# Patient Record
Sex: Male | Born: 2000 | Race: Black or African American | Hispanic: No | Marital: Single | Smoking: Never smoker
Health system: Southern US, Community
[De-identification: ages and names within clinical notes are randomized; demographics above are authoritative.]

## PROBLEM LIST (undated history)

## (undated) DIAGNOSIS — K219 Gastro-esophageal reflux disease without esophagitis: Secondary | ICD-10-CM

## (undated) DIAGNOSIS — J45909 Unspecified asthma, uncomplicated: Secondary | ICD-10-CM

---
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
## 2022-11-08 ENCOUNTER — Other Ambulatory Visit (INDEPENDENT_AMBULATORY_CARE_PROVIDER_SITE_OTHER): Payer: Self-pay | Admitting: Neurology

## 2022-11-08 MED ORDER — AMITRIPTYLINE HCL 25 MG PO TABS: 12.5000 mg | ORAL_TABLET | Freq: Every day | ORAL | 0 refills | Status: DC

## 2022-11-08 NOTE — Telephone Encounter (Signed)
  Name of who is calling: Royann Shivers Relationship to Patient: Mom  Best contact number: [phone]  Provider they see: Dr.Nab  Reason for call: Mom is calling to speak with provider or someone from clinical staff. She states that Baine's been having  bad headaches. The medications that provider prescribed doesn't work. Mom is requesting a callback.      PRESCRIPTION REFILL ONLY  Name of prescription: amitriptyline '25mg'$   Pharmacy: [facility]

## 2022-11-08 NOTE — Telephone Encounter (Signed)
Called and notified "Mom" that patient will need to call office to discuss any information re: himself or his care (Due to age).  B. Roten CMA  Note: Upon return call discuss the following:  Last Visit was 12-08-2020 and the following discussed:  Instructions   Return in about 7 months (around 07/10/2021). Continue the same low dose of amitriptyline at half a tablet every night May benefit from taking dietary supplements Continue with more hydration, adequate sleep and limiting screen time May take occasional Tylenol or ibuprofen for moderate to severe headache Call my office if there are more frequent headaches Return in 7 months for follow-up visit     Patient-Parent (No Show'd) follow up appt, no appointments or calls since.  B. Roten CMA

## 2022-11-08 NOTE — Telephone Encounter (Signed)
HEADACHES  Acute Headache or Most recent.   When did the headache begin? Last Lamonte Sakai began "today" started "by coming out of no where" and the "last few weeks they have been more recurrent and seem to come around more with stress".    How does it compare to other headaches? "They vary, lately with nausea". Seems to be a cluster "and varies".    Describe the pain. "Throbbing" most recently.   Is photosensitivity present? "Most recent one's, yes".    Is the visual disturbed? If so, how? No   Does noise worsen the headache? "The most recent, yes" and "have to get in a quite place".    Does activity worsen or relieve the headache? "It kinda depends, most times, less activity helps".   Is nausea present? "The most recent, yes"   Is nausea present? If so, how may times and in what period of time? "Just the one from yesterday for a small amount of time, no vomiting"   Rate the headache on a 0-10 scale. 8-"most recent worst one"   Have medication been tried? If so, what medication, what dose, when was the medication given. If this is a prolonged headache, how many doses have been given? Still had the Amitriptyline and used that, Taking "the whole 25 mg nightly". Restarted recently.   Have any other treatments been tried? Has anything relieved the headache or made it worse? "Haven't had to use medicine in a while".    If the patient awakened with the headache - clarify if they woke up with the headache or if the headache woke them up. "On Monday, awakened with ha".   Send the message to provider  Note: I let the patient know that I will send to provider for review. No recent visit and due to age of patient will await advise from Dr. Jordan Hawks.  Lurline Idol CMA  Patient can be reached at: [phone]

## 2022-11-11 NOTE — Telephone Encounter (Signed)
LM for patient that Rx was sent. If any further refills are needed (appointment is recommended). Also, LM that an additional visit is needed to discuss current symptoms and need for transition to adult neurology.  B. Roten CMA

## 2022-11-11 NOTE — Therapy (Signed)
OUTPATIENT PHYSICAL THERAPY LOWER EXTREMITY EVALUATION   Patient Name: Tony Gray MRN: [id number] DOB:10-11-2000, 22 y.o., male Today's Date: 11/13/2022  END OF SESSION:  PT End of Session - 11/12/22 1401     Visit Number 1    Number of Visits 17    Date for PT Re-Evaluation 01/11/23    Authorization Type [employer]    PT Start Time 1401    PT Stop Time 1436    PT Time Calculation (min) 35 min    Activity Tolerance Patient tolerated treatment well    Behavior During Therapy WFL for tasks assessed/performed             Past Medical History:  Diagnosis Date   Acid reflux    Asthma    History reviewed. No pertinent surgical history. There are no problems to display for this patient.   PCP[facility]   REFERRING PROVIDER: Margart Sickles, PA-C  REFERRING DIAG:  S80.02XA (ICD-10-CM) - Contusion of left knee  S93.402A (ICD-10-CM) - Left ankle sprain    THERAPY DIAG:  Pain in left ankle and joints of left foot  Acute pain of left knee  Muscle weakness (generalized)  Other abnormalities of gait and mobility  Localized edema  Rationale for Evaluation and Treatment: Rehabilitation  ONSET DATE: January 2023   SUBJECTIVE:   SUBJECTIVE STATEMENT: In early January he was at track practice and was doing a long jump and when he landed the Lt ankle ankle rolled in and his knee collapsed in. He reports having immediate pain more so in the knee than the ankle. He remembers hearing a pop in the ankle. He was able to walk after this injury and was able to finish track practice that day. The following day he was having more pain. He talked with his coach about his pain and was instructed to rest and use ice for a couple weeks. His knee pain improved a little bit following this period of rest, but his ankle pain stayed the same. He was then seen by orthopedics and had an MRI on the knee that showed a bone bruise and an X-ray of the  ankle that was unremarkable. He reports his knee pain has gone away at this point, but he continues to have ankle pain. He notices more pain with any sort of pressure on the foot.   PERTINENT HISTORY: None  PAIN:  Are you having pain? Yes: NPRS scale: 7 (9 at worst)/10 Pain location: Lt lateral ankle Pain description: sharp;throbbing Aggravating factors: prolonged standing, excessive movement Relieving factors: sitting, rest  PRECAUTIONS: None  WEIGHT BEARING RESTRICTIONS: No  FALLS:  Has patient fallen in last 6 months? No  LIVING ENVIRONMENT: Lives with: lives with their family Lives in: House/apartment Stairs: No Has following equipment at home: None  OCCUPATION: [employer]  PLOF: Independent  PATIENT GOALS: "lower the pain and get back to exercise"    OBJECTIVE:   DIAGNOSTIC FINDINGS: none on file   PATIENT SURVEYS:  LEFS 63/80  COGNITION: Overall cognitive status: Within functional limits for tasks assessed     SENSATION: Not tested  EDEMA:  Mild swelling about Lt lateral ankle    POSTURE: No Significant postural limitations  PALPATION: TTP ATFL and CFL   LOWER EXTREMITY ROM:  Active ROM Right eval Left eval  Hip flexion    Hip extension    Hip abduction    Hip adduction    Hip internal rotation  Hip external rotation    Knee flexion  WNL  Knee extension  WNL  Ankle dorsiflexion 12 2*  Ankle plantarflexion  WNL  Ankle inversion 20 20*  Ankle eversion 15 8*    (Blank rows = not tested)  LOWER EXTREMITY MMT:  MMT Right eval Left eval  Hip flexion    Hip extension    Hip abduction    Hip adduction    Hip internal rotation    Hip external rotation    Knee flexion  5  Knee extension  5  Ankle dorsiflexion  4+ pn  Ankle plantarflexion Full SL calf raise Partial SL calf raise  Ankle inversion  4 pn  Ankle eversion  5   (Blank rows = not tested)  LOWER EXTREMITY SPECIAL TESTS:  (+ pain) Anterior Drawer (+ pain) talar  tilt (-) Squeeze   FUNCTIONAL TESTS:  Squat: WNL SLS: 30 seconds bilateral  SLS on airex: 30 seconds bilateral, moderate sway LLE   GAIT: Distance walked: 15 ft  Assistive device utilized: None Level of assistance: Complete Independence Comments: antalgic Lt    [facility] Adult PT Treatment:                                                DATE: 11/12/22 Therapeutic Exercise: Demonstrated and issue initial HEP.    Therapeutic Activity: Education on assessment findings that will be addressed throughout duration of POC.       PATIENT EDUCATION:  Education details: see treatment  Person educated: Patient Education method: Explanation, Demonstration, Tactile cues, Verbal cues, and Handouts Education comprehension: verbalized understanding, returned demonstration, verbal cues required, tactile cues required, and needs further education  HOME EXERCISE PROGRAM: Access Code: [email or web address] URL: [email or web address] Date: 11/12/2022 Prepared by: Letitia Libra  Exercises - Long Sitting Calf Stretch with Strap  - 1 x daily - 7 x weekly - 3 sets - 30 sec  hold - Isometric Ankle Inversion at Wall  - 1 x daily - 7 x weekly - 2 sets - 10 reps - 5 sec hold - Isometric Ankle Eversion at Wall  - 1 x daily - 7 x weekly - 2 sets - 10 reps - 5 sec hold - Long Sitting Isometric Ankle Plantarflexion with Ball at [employer]  - 1 x daily - 7 x weekly - 2 sets - 10 reps - 5 sec hold - Isometric Ankle Dorsiflexion and Plantarflexion  - 1 x daily - 7 x weekly - 2 sets - 10 reps - 5 sec hold - Seated Heel Raise  - 1 x daily - 7 x weekly - 2 sets - 10 reps - Seated Toe Raise  - 1 x daily - 7 x weekly - 2 sets - 10 reps  ASSESSMENT:  CLINICAL IMPRESSION: Patient is a 22 y.o. male who was seen today for physical therapy evaluation and treatment for acute Lt ankle and knee pain that he sustained when landing from a high jump maneuver in January 2024. He reports recent resolution of his knee pain, but has  ongoing Lt lateral ankle pain that is consistent with a lateral ankle sprain. He demonstrates limited and painful Lt ankle AROM as well as weakness, balance impairments, and gait abnormalities. He is unable to participate in sport-specific activity at this time (including running and jumping) secondary to pain and the above objective impairments. He  will benefit from skilled PT to address the above stated deficits in order to return to PLOF.   OBJECTIVE IMPAIRMENTS: Abnormal gait, decreased activity tolerance, decreased balance, decreased endurance, decreased knowledge of condition, decreased mobility, difficulty walking, decreased ROM, decreased strength, hypomobility, impaired flexibility, improper body mechanics, and pain.   ACTIVITY LIMITATIONS: carrying, lifting, bending, standing, squatting, and locomotion level  PARTICIPATION LIMITATIONS: meal prep, cleaning, laundry, shopping, community activity, and sport activity/recreation  PERSONAL FACTORS: Profession and Time since onset of injury/illness/exacerbation are also affecting patient's functional outcome.   REHAB POTENTIAL: Good  CLINICAL DECISION MAKING: Stable/uncomplicated  EVALUATION COMPLEXITY: Low   GOALS: Goals reviewed with patient? Yes  SHORT TERM GOALS: Target date: 12/10/2022   Patient will be independent and compliant with initial HEP.   Baseline: issued at eval  Goal status: INITIAL  2.  Patient will demonstrate at least 13 degrees of Lt ankle eversion AROM to improve gait mechanics.  Baseline: see above  Goal status: INITIAL  3.  Patient will demonstrate at least 10 degrees of Lt ankle DF AROM to reduce stress on the ankle and knee with squatting and jumping activity.  Baseline: see above Goal status: INITIAL  4.  Patient will be able to complete full range SL calf raise on the LLE without pain to improve his ability to complete SL hopping/jumping.  Baseline: see above  Goal status: INITIAL   LONG TERM  GOALS: Target date: 01/08/2023    Patient will demonstrate pain free 5/5 Lt ankle strength to improve stability with running activity.  Baseline: see above  Goal status: INITIAL  2.  Patient will score at least 73/80 on the LEFS to signify clinically meaningful improvement in functional abilities.  Baseline: see above  Goal status: INITIAL  3.  Patient will report pain as worst rated as 4/10 to reduce his current functional limitations.  Baseline: 9 Goal status: INITIAL  4.  Patient will be able to jump and hop with no signs of instability without pain in order to return to sport.  Baseline: unable  Goal status: INITIAL  5.  Patient will be independent with advanced home program to progress/maintain current level of function.  Baseline: initial HEP issued  Goal status: INITIAL    PLAN:  PT FREQUENCY: 2x/week  PT DURATION: 8 weeks  PLANNED INTERVENTIONS: Therapeutic exercises, Therapeutic activity, Neuromuscular re-education, Balance training, Gait training, Patient/Family education, Self Care, Joint mobilization, Aquatic Therapy, Dry Needling, Electrical stimulation, Cryotherapy, Vasopneumatic device, Ionotophoresis 4mg /ml Dexamethasone, Manual therapy, and Re-evaluation  PLAN FOR NEXT SESSION: review and update HEP prn; manual to Lt ankle consider MWM, ankle strengthening and stretching.   Letitia Libra, PT, DPT, ATC 11/13/22 1:39 PM  Check all possible CPT codes: 16109 - PT Re-evaluation, 97110- Therapeutic Exercise, [phone]- Neuro Re-education, [phone] - Gait Training, [phone] - Manual Therapy, [phone] - Therapeutic Activities, [phone] - Self Care, and [phone] - Aquatic therapy    Check all conditions that are expected to impact treatment: None of these apply   If treatment provided at initial evaluation, no treatment charged due to lack of authorization.

## 2022-11-12 ENCOUNTER — Other Ambulatory Visit: Payer: Self-pay

## 2022-11-12 ENCOUNTER — Ambulatory Visit: Payer: Medicaid Other | Attending: Physician Assistant

## 2022-11-12 DIAGNOSIS — R2689 Other abnormalities of gait and mobility: Secondary | ICD-10-CM

## 2022-11-12 DIAGNOSIS — M6281 Muscle weakness (generalized): Secondary | ICD-10-CM | POA: Diagnosis present

## 2022-11-12 DIAGNOSIS — M25562 Pain in left knee: Secondary | ICD-10-CM | POA: Diagnosis present

## 2022-11-12 DIAGNOSIS — R600 Localized edema: Secondary | ICD-10-CM | POA: Diagnosis present

## 2022-11-12 DIAGNOSIS — M25572 Pain in left ankle and joints of left foot: Secondary | ICD-10-CM

## 2022-11-21 ENCOUNTER — Ambulatory Visit: Payer: Medicaid Other | Admitting: Physical Therapy

## 2022-11-26 ENCOUNTER — Ambulatory Visit: Payer: Medicaid Other

## 2022-11-26 DIAGNOSIS — R2689 Other abnormalities of gait and mobility: Secondary | ICD-10-CM

## 2022-11-26 DIAGNOSIS — M6281 Muscle weakness (generalized): Secondary | ICD-10-CM

## 2022-11-26 DIAGNOSIS — R600 Localized edema: Secondary | ICD-10-CM

## 2022-11-26 DIAGNOSIS — M25562 Pain in left knee: Secondary | ICD-10-CM

## 2022-11-26 DIAGNOSIS — M25572 Pain in left ankle and joints of left foot: Secondary | ICD-10-CM

## 2022-11-26 NOTE — Therapy (Signed)
OUTPATIENT PHYSICAL THERAPY TREATMENT NOTE   Patient Name: Tony Gray MRN: [id number] DOB:02-26-2001, 22 y.o., male Today's Date: 11/26/2022  PCP: [facility]  REFERRING PROVIDER: Chriss Czar, PA-C   END OF SESSION:   PT End of Session - 11/26/22 1236     Visit Number 2    Number of Visits 17    Date for PT Re-Evaluation 01/11/23    Authorization Type [employer]    Authorization Time Period 3/14-5/12/24    Authorization - Visit Number 1    Authorization - Number of Visits 5    PT Start Time [id number]    PT Stop Time 1316    PT Time Calculation (min) 40 min    Activity Tolerance Patient tolerated treatment well    Behavior During Therapy WFL for tasks assessed/performed             Past Medical History:  Diagnosis Date   Acid reflux    Asthma    History reviewed. No pertinent surgical history. There are no problems to display for this patient.   REFERRING DIAG:  S80.02XA (ICD-10-CM) - Contusion of left knee  S93.402A (ICD-10-CM) - Left ankle sprain    THERAPY DIAG:  Pain in left ankle and joints of left foot  Acute pain of left knee  Muscle weakness (generalized)  Other abnormalities of gait and mobility  Localized edema  Rationale for Evaluation and Treatment Rehabilitation  PERTINENT HISTORY: none   PRECAUTIONS: none  SUBJECTIVE:                                                                                                                                                                                      SUBJECTIVE STATEMENT:  Patient reports the exercises are helping and his ankle is feeling better.    PAIN:  Are you having pain? No   OBJECTIVE: (objective measures completed at initial evaluation unless otherwise dated)  DIAGNOSTIC FINDINGS: none on file    PATIENT SURVEYS:  LEFS 63/80   COGNITION: Overall cognitive status: Within functional limits for tasks assessed                          SENSATION: Not tested   EDEMA:  Mild swelling about Lt lateral ankle      POSTURE: No Significant postural limitations   PALPATION: TTP ATFL and CFL    LOWER EXTREMITY ROM:   Active ROM Right eval Left eval 11/26/22 Left   Hip flexion       Hip extension       Hip abduction  Hip adduction       Hip internal rotation       Hip external rotation       Knee flexion   WNL   Knee extension   WNL   Ankle dorsiflexion 12 2* 11  Ankle plantarflexion   WNL   Ankle inversion 20 20*   Ankle eversion 15 8*     (Blank rows = not tested)   LOWER EXTREMITY MMT:   MMT Right eval Left eval  Hip flexion      Hip extension      Hip abduction      Hip adduction      Hip internal rotation      Hip external rotation      Knee flexion   5  Knee extension   5  Ankle dorsiflexion   4+ pn  Ankle plantarflexion Full SL calf raise Partial SL calf raise  Ankle inversion   4 pn  Ankle eversion   5   (Blank rows = not tested)   LOWER EXTREMITY SPECIAL TESTS:  (+ pain) Anterior Drawer (+ pain) talar tilt (-) Squeeze    FUNCTIONAL TESTS:  Squat: WNL SLS: 30 seconds bilateral  SLS on airex: 30 seconds bilateral, moderate sway LLE    GAIT: Distance walked: 15 ft  Assistive device utilized: None Level of assistance: Complete Independence Comments: antalgic Lt    [facility] Adult PT Treatment:                                                DATE: 11/26/22 Therapeutic Exercise: Recumbent bike level 3 x 5 minutes  Calf stretch on wedge gastroc and soleus x 30 sec each  Resisted ankle inversion/eversion and dorsiflexion red band 2 x 10  Resisted ankle plantarflexion black band 2 x 10  Standing ankle rockerboard 2 x 10  Eccentric calf raise and DL calf raises attempted d/c due to pain DL calf raise on leg press @ 20 lbs 2 x 10  Manual Therapy: Lt ankle inversion MWM x 10  Lt Ankle mobilizations to improve DF/PF    [facility] Adult PT Treatment:                                                 DATE: 11/12/22 Therapeutic Exercise: Demonstrated and issue initial HEP.      Therapeutic Activity: Education on assessment findings that will be addressed throughout duration of POC.            PATIENT EDUCATION:  Education details: see treatment  Person educated: Patient Education method: Explanation, Demonstration, Tactile cues, Verbal cues, and Handouts Education comprehension: verbalized understanding, returned demonstration, verbal cues required, tactile cues required, and needs further education   HOME EXERCISE PROGRAM: Access Code: [email or web address] URL: [email or web address] Date: 11/12/2022 Prepared by: Gwendolyn Grant   Exercises - Long Sitting Calf Stretch with Strap  - 1 x daily - 7 x weekly - 3 sets - 30 sec  hold - Isometric Ankle Inversion at Wall  - 1 x daily - 7 x weekly - 2 sets - 10 reps - 5 sec hold - Isometric Ankle Eversion at Wall  - 1 x daily - 7 x weekly - 2 sets -  10 reps - 5 sec hold - Long Sitting Isometric Ankle Plantarflexion with Ball at [employer]  - 1 x daily - 7 x weekly - 2 sets - 10 reps - 5 sec hold - Isometric Ankle Dorsiflexion and Plantarflexion  - 1 x daily - 7 x weekly - 2 sets - 10 reps - 5 sec hold - Seated Heel Raise  - 1 x daily - 7 x weekly - 2 sets - 10 reps - Seated Toe Raise  - 1 x daily - 7 x weekly - 2 sets - 10 reps   ASSESSMENT:   CLINICAL IMPRESSION: Patient arrives without reports of ankle or knee pain. Lt ankle DF AROM has significantly improved compared to evaluation having met this short term functional goal. Able to initiate ankle resistance strengthening today without onset of lateral ankle pain. He reported sharp lateral ankle pain when attempted DL and eccentric calf raise, so these were discontinued. Able to complete calf raises on the leg press without onset of pain. HEP updated to include further strengthening.    OBJECTIVE IMPAIRMENTS: Abnormal gait, decreased activity tolerance, decreased balance, decreased  endurance, decreased knowledge of condition, decreased mobility, difficulty walking, decreased ROM, decreased strength, hypomobility, impaired flexibility, improper body mechanics, and pain.    ACTIVITY LIMITATIONS: carrying, lifting, bending, standing, squatting, and locomotion level   PARTICIPATION LIMITATIONS: meal prep, cleaning, laundry, shopping, community activity, and sport activity/recreation   PERSONAL FACTORS: Profession and Time since onset of injury/illness/exacerbation are also affecting patient's functional outcome.    REHAB POTENTIAL: Good   CLINICAL DECISION MAKING: Stable/uncomplicated   EVALUATION COMPLEXITY: Low     GOALS: Goals reviewed with patient? Yes   SHORT TERM GOALS: Target date: 12/10/2022     Patient will be independent and compliant with initial HEP.    Baseline: issued at eval  Goal status: INITIAL   2.  Patient will demonstrate at least 13 degrees of Lt ankle eversion AROM to improve gait mechanics.  Baseline: see above  Goal status: INITIAL   3.  Patient will demonstrate at least 10 degrees of Lt ankle DF AROM to reduce stress on the ankle and knee with squatting and jumping activity.  Baseline: see above Goal status: met   4.  Patient will be able to complete full range SL calf raise on the LLE without pain to improve his ability to complete SL hopping/jumping.  Baseline: see above  Goal status: INITIAL     LONG TERM GOALS: Target date: 01/08/2023       Patient will demonstrate pain free 5/5 Lt ankle strength to improve stability with running activity.  Baseline: see above  Goal status: INITIAL   2.  Patient will score at least 73/80 on the LEFS to signify clinically meaningful improvement in functional abilities.  Baseline: see above  Goal status: INITIAL   3.  Patient will report pain as worst rated as 4/10 to reduce his current functional limitations.  Baseline: 9 Goal status: INITIAL   4.  Patient will be able to jump and hop  with no signs of instability without pain in order to return to sport.  Baseline: unable  Goal status: INITIAL   5.  Patient will be independent with advanced home program to progress/maintain current level of function.  Baseline: initial HEP issued  Goal status: INITIAL       PLAN:   PT FREQUENCY: 2x/week   PT DURATION: 8 weeks   PLANNED INTERVENTIONS: Therapeutic exercises, Therapeutic activity, Neuromuscular re-education, Balance  training, Gait training, Patient/Family education, Self Care, Joint mobilization, Aquatic Therapy, Dry Needling, Electrical stimulation, Cryotherapy, Vasopneumatic device, Ionotophoresis 4mg /ml Dexamethasone, Manual therapy, and Re-evaluation   PLAN FOR NEXT SESSION: review and update HEP prn;  ankle strengthening and stretching. Balance     Gwendolyn Grant, PT, DPT, ATC 11/26/22 1:16 PM

## 2022-11-28 ENCOUNTER — Other Ambulatory Visit: Payer: Self-pay

## 2022-11-28 ENCOUNTER — Ambulatory Visit: Payer: Medicaid Other | Admitting: Physical Therapy

## 2022-11-28 ENCOUNTER — Encounter: Payer: Self-pay | Admitting: Physical Therapy

## 2022-11-28 DIAGNOSIS — M25572 Pain in left ankle and joints of left foot: Secondary | ICD-10-CM

## 2022-11-28 DIAGNOSIS — M6281 Muscle weakness (generalized): Secondary | ICD-10-CM

## 2022-11-28 DIAGNOSIS — R600 Localized edema: Secondary | ICD-10-CM

## 2022-11-28 DIAGNOSIS — M25562 Pain in left knee: Secondary | ICD-10-CM

## 2022-11-28 DIAGNOSIS — R2689 Other abnormalities of gait and mobility: Secondary | ICD-10-CM

## 2022-11-28 NOTE — Therapy (Signed)
OUTPATIENT PHYSICAL THERAPY TREATMENT NOTE   Patient Name: Tony Gray MRN: [id number] DOB:[birth year], 22 y.o., male Today's Date: 11/28/2022   PCP: [facility]  REFERRING PROVIDER: Chriss Czar, PA-C    END OF SESSION:   PT End of Session - 11/28/22 1413     Visit Number 3    Number of Visits 17    Date for PT Re-Evaluation 01/11/23    Authorization Type [employer]    Authorization Time Period 3/14-5/12/24    Authorization - Visit Number 2    Authorization - Number of Visits 5    PT Start Time [id number]    PT Stop Time 1445    PT Time Calculation (min) 41 min    Activity Tolerance Patient tolerated treatment well    Behavior During Therapy WFL for tasks assessed/performed              Past Medical History:  Diagnosis Date   Acid reflux    Asthma    History reviewed. No pertinent surgical history. There are no problems to display for this patient.   REFERRING DIAG:  S80.02XA (ICD-10-CM) - Contusion of left knee  S93.402A (ICD-10-CM) - Left ankle sprain    THERAPY DIAG:  Pain in left ankle and joints of left foot  Acute pain of left knee  Muscle weakness (generalized)  Other abnormalities of gait and mobility  Localized edema  Rationale for Evaluation and Treatment Rehabilitation  PERTINENT HISTORY: None   PRECAUTIONS: None   SUBJECTIVE:                                                                                                                                                                                     SUBJECTIVE STATEMENT:  Patient reports continued improvement. He does continue to get pain on the lateral ankle with running.  PAIN:  Are you having pain? No   OBJECTIVE: (objective measures completed at initial evaluation unless otherwise dated) PATIENT SURVEYS:  LEFS 63/80   EDEMA:  Mild swelling about Lt lateral ankle   PALPATION: TTP ATFL and CFL    LOWER EXTREMITY ROM:   Active  ROM Right eval Left eval 11/26/22 Left   Hip flexion       Hip extension       Hip abduction       Hip adduction       Hip internal rotation       Hip external rotation       Knee flexion   WNL   Knee extension   WNL   Ankle dorsiflexion 12 2* 11  Ankle plantarflexion   WNL  Ankle inversion 20 20*   Ankle eversion 15 8*     (Blank rows = not tested)   LOWER EXTREMITY MMT:   MMT Right eval Left eval  Hip flexion      Hip extension      Hip abduction      Hip adduction      Hip internal rotation      Hip external rotation      Knee flexion   5  Knee extension   5  Ankle dorsiflexion   4+ pn  Ankle plantarflexion Full SL calf raise Partial SL calf raise  Ankle inversion   4 pn  Ankle eversion   5   (Blank rows = not tested)   LOWER EXTREMITY SPECIAL TESTS:  (+ pain) Anterior Drawer (+ pain) talar tilt (-) Squeeze    FUNCTIONAL TESTS:  Squat: WNL SLS: 30 seconds bilateral  SLS on airex: 30 seconds bilateral, moderate sway LLE    GAIT: Distance walked: 15 ft  Assistive device utilized: None Level of assistance: Complete Independence Comments: antalgic Lt     TODAY'S TREATMENT [facility] Adult PT Treatment:                                                DATE: 11/28/22 Therapeutic Exercise: Recumbent bike L3 x 5 minutes  Slant board calf stretch 3 x 30 sec Heel raises x 10, with ball squeeze between heels 2 x 10 SLS x 30 sec, on Airex 2 x 30 sec Forward heel tap 4" x 10, 6" x 10 Triple extension at wall 2 x 5 each SL squat on BOSU 2 x 5 each - fingertip assist for balance POGO hop stationary 2 x 20, fwd/bwd 2 x 20, lateral 2 x 20   [facility] Adult PT Treatment:                                                DATE: 11/26/22 Therapeutic Exercise: Recumbent bike level 3 x 5 minutes  Calf stretch on wedge gastroc and soleus x 30 sec each  Resisted ankle inversion/eversion and dorsiflexion red band 2 x 10  Resisted ankle plantarflexion black band 2 x 10  Standing ankle  rockerboard 2 x 10  Eccentric calf raise and DL calf raises attempted d/c due to pain DL calf raise on leg press @ 20 lbs 2 x 10  Manual Therapy: Lt ankle inversion MWM x 10  Lt Ankle mobilizations to improve DF/PF   PATIENT EDUCATION:  Education details: HEP update Person educated: Patient Education method: Explanation, Demonstration, Tactile cues, Verbal cues, and Handouts Education comprehension: verbalized understanding, returned demonstration, verbal cues required, tactile cues required, and needs further education   HOME EXERCISE PROGRAM: Access Code: [email or web address]     ASSESSMENT: CLINICAL IMPRESSION: Patient tolerated therapy well with no adverse effects. Therapy focused on progressing left ankle strength and control and initiated plyometric exercises with good tolerance. He was able to tolerate standing strengthening well this visit with no report of pain. He does demonstrate greater arch collapse on the left with weight bearing squatting tasks and had greater difficulty with SL control on the left. Updated his HEP to progress standing strengthening and plyometrics for home to  improve ankle control and tolerance for impact tasks such as running. Patient would benefit from continued skilled PT to progress his mobility and strength in order to reduce pain and maximize functional ability.    OBJECTIVE IMPAIRMENTS: Abnormal gait, decreased activity tolerance, decreased balance, decreased endurance, decreased knowledge of condition, decreased mobility, difficulty walking, decreased ROM, decreased strength, hypomobility, impaired flexibility, improper body mechanics, and pain.    ACTIVITY LIMITATIONS: carrying, lifting, bending, standing, squatting, and locomotion level   PARTICIPATION LIMITATIONS: meal prep, cleaning, laundry, shopping, community activity, and sport activity/recreation   PERSONAL FACTORS: Profession and Time since onset of injury/illness/exacerbation are also affecting  patient's functional outcome.      GOALS: Goals reviewed with patient? Yes   SHORT TERM GOALS: Target date: 12/10/2022    Patient will be independent and compliant with initial HEP.    Baseline: issued at eval  Goal status: INITIAL   2.  Patient will demonstrate at least 13 degrees of Lt ankle eversion AROM to improve gait mechanics.  Baseline: see above  Goal status: INITIAL   3.  Patient will demonstrate at least 10 degrees of Lt ankle DF AROM to reduce stress on the ankle and knee with squatting and jumping activity.  Baseline: see above Goal status: met   4.  Patient will be able to complete full range SL calf raise on the LLE without pain to improve his ability to complete SL hopping/jumping.  Baseline: see above  Goal status: INITIAL     LONG TERM GOALS: Target date: 01/08/2023   Patient will demonstrate pain free 5/5 Lt ankle strength to improve stability with running activity.  Baseline: see above  Goal status: INITIAL   2.  Patient will score at least 73/80 on the LEFS to signify clinically meaningful improvement in functional abilities.  Baseline: see above  Goal status: INITIAL   3.  Patient will report pain as worst rated as 4/10 to reduce his current functional limitations.  Baseline: 9 Goal status: INITIAL   4.  Patient will be able to jump and hop with no signs of instability without pain in order to return to sport.  Baseline: unable  Goal status: INITIAL   5.  Patient will be independent with advanced home program to progress/maintain current level of function.  Baseline: initial HEP issued  Goal status: INITIAL       PLAN: PT FREQUENCY: 2x/week   PT DURATION: 8 weeks   PLANNED INTERVENTIONS: Therapeutic exercises, Therapeutic activity, Neuromuscular re-education, Balance training, Gait training, Patient/Family education, Self Care, Joint mobilization, Aquatic Therapy, Dry Needling, Electrical stimulation, Cryotherapy, Vasopneumatic device,  Ionotophoresis 4mg /ml Dexamethasone, Manual therapy, and Re-evaluation   PLAN FOR NEXT SESSION: review and update HEP prn;  ankle strengthening and stretching. Balance     Hilda Blades, PT, DPT, LAT, ATC 11/28/22  3:52 PM Phone: [phone] Fax: [phone]

## 2022-11-28 NOTE — Patient Instructions (Signed)
Access Code: [email or web address] URL: [email or web address] Date: 11/28/2022 Prepared by: Hilda Blades  Exercises - Seated Heel Raise  - 1 x daily - 7 x weekly - 2 sets - 10 reps - Seated Toe Raise  - 1 x daily - 7 x weekly - 2 sets - 10 reps - Seated Ankle Eversion with Resistance  - 1 x daily - 2 sets - 10 reps - Seated Figure 4 Ankle Inversion with Resistance  - 1 x daily - 2 sets - 10 reps - Long Sitting Ankle Dorsiflexion with Anchored Resistance  - 1 x daily - 2 sets - 10 reps - Long Sitting Ankle Plantar Flexion with Resistance  - 1 x daily - 2 sets - 10 reps - Gastroc Stretch on Wall  - 1 x daily - 3 reps - 30 seconds hold - Soleus Stretch on Wall  - 1 x daily - 3 reps - 30 seconds hold - Standing Heel Raise  - 1 x daily - 3 sets - 10 reps - Forward Tape Jumps  - 3 sets - 20 reps - Sideways Tape Jumps  - 3 sets - 20 reps

## 2022-12-03 ENCOUNTER — Ambulatory Visit: Payer: Medicaid Other

## 2022-12-03 ENCOUNTER — Telehealth: Payer: Self-pay

## 2022-12-03 NOTE — Telephone Encounter (Signed)
LVM regarding missed PT appointment. Reviewed attendance policy and reminded patient of next scheduled visit.   Gwendolyn Grant, PT, DPT, ATC 12/03/22 10:00 AM

## 2022-12-05 ENCOUNTER — Ambulatory Visit: Payer: Medicaid Other

## 2022-12-05 DIAGNOSIS — M25562 Pain in left knee: Secondary | ICD-10-CM

## 2022-12-05 DIAGNOSIS — M25572 Pain in left ankle and joints of left foot: Secondary | ICD-10-CM | POA: Diagnosis not present

## 2022-12-05 DIAGNOSIS — R2689 Other abnormalities of gait and mobility: Secondary | ICD-10-CM

## 2022-12-05 DIAGNOSIS — M6281 Muscle weakness (generalized): Secondary | ICD-10-CM

## 2022-12-05 DIAGNOSIS — R600 Localized edema: Secondary | ICD-10-CM

## 2022-12-05 NOTE — Therapy (Signed)
OUTPATIENT PHYSICAL THERAPY TREATMENT NOTE   Patient Name: Tony Gray MRN: [id number] DOB:2000-10-14, 22 y.o., male Today's Date: 12/05/2022   PCP: [facility]  REFERRING PROVIDER: Chriss Czar, PA-C    END OF SESSION:   PT End of Session - 12/05/22 0942     Visit Number 4    Number of Visits 17    Date for PT Re-Evaluation 01/11/23    Authorization Type [employer]    Authorization Time Period 3/14-5/12/24    Authorization - Visit Number 3    Authorization - Number of Visits 5    PT Start Time [phone]   patient late   PT Stop Time 1015    PT Time Calculation (min) 33 min    Activity Tolerance Patient tolerated treatment well    Behavior During Therapy [facility] for tasks assessed/performed               Past Medical History:  Diagnosis Date   Acid reflux    Asthma    History reviewed. No pertinent surgical history. There are no problems to display for this patient.   REFERRING DIAG:  S80.02XA (ICD-10-CM) - Contusion of left knee  S93.402A (ICD-10-CM) - Left ankle sprain    THERAPY DIAG:  Pain in left ankle and joints of left foot  Acute pain of left knee  Muscle weakness (generalized)  Other abnormalities of gait and mobility  Localized edema  Rationale for Evaluation and Treatment Rehabilitation  PERTINENT HISTORY: None   PRECAUTIONS: None   SUBJECTIVE:                                                                                                                                                                                     SUBJECTIVE STATEMENT:  Patient reports the ankle feels good right now without pain. His banded ankle exercises are getting easier.   PAIN:  Are you having pain? No   OBJECTIVE: (objective measures completed at initial evaluation unless otherwise dated) PATIENT SURVEYS:  LEFS 63/80   EDEMA:  Mild swelling about Lt lateral ankle   PALPATION: TTP ATFL and CFL    LOWER  EXTREMITY ROM:   Active ROM Right eval Left eval 11/26/22 Left  12/05/22 Left   Hip flexion        Hip extension        Hip abduction        Hip adduction        Hip internal rotation        Hip external rotation        Knee flexion   WNL    Knee extension  WNL    Ankle dorsiflexion 12 2* 11   Ankle plantarflexion   WNL    Ankle inversion 20 20*  21  Ankle eversion 15 8*   18   (Blank rows = not tested)   LOWER EXTREMITY MMT:   MMT Right eval Left eval  Hip flexion      Hip extension      Hip abduction      Hip adduction      Hip internal rotation      Hip external rotation      Knee flexion   5  Knee extension   5  Ankle dorsiflexion   4+ pn  Ankle plantarflexion Full SL calf raise Partial SL calf raise  Ankle inversion   4 pn  Ankle eversion   5   (Blank rows = not tested)   LOWER EXTREMITY SPECIAL TESTS:  (+ pain) Anterior Drawer (+ pain) talar tilt (-) Squeeze    FUNCTIONAL TESTS:  Squat: WNL SLS: 30 seconds bilateral  SLS on airex: 30 seconds bilateral, moderate sway LLE    GAIT: Distance walked: 15 ft  Assistive device utilized: None Level of assistance: Complete Independence Comments: antalgic Lt     TODAY'S TREATMENT [facility] Adult PT Treatment:                                                DATE: 12/03/22 Therapeutic Exercise: Elliptical level 4 ramp 6 x 5 minutes  Walking lunge 4 x 15 ft  SL leg press 2 x 10 @ 80 lbs, 100 lbs SL calf raise 2 x 10   Squat jumps 3 x 5 Heel/toe walks 2 x 15 ft  Updated HEP   Neuromuscular re-ed: Step up knee driver on bosu 2 x 10 forward and lateral  SL marble pickup with shin trace x 10 each     [facility] Adult PT Treatment:                                                DATE: 11/28/22 Therapeutic Exercise: Recumbent bike L3 x 5 minutes  Slant board calf stretch 3 x 30 sec Heel raises x 10, with ball squeeze between heels 2 x 10 SLS x 30 sec, on Airex 2 x 30 sec Forward heel tap 4" x 10, 6" x 10 Triple  extension at wall 2 x 5 each SL squat on BOSU 2 x 5 each - fingertip assist for balance POGO hop stationary 2 x 20, fwd/bwd 2 x 20, lateral 2 x 20   [facility] Adult PT Treatment:                                                DATE: 11/26/22 Therapeutic Exercise: Recumbent bike level 3 x 5 minutes  Calf stretch on wedge gastroc and soleus x 30 sec each  Resisted ankle inversion/eversion and dorsiflexion red band 2 x 10  Resisted ankle plantarflexion black band 2 x 10  Standing ankle rockerboard 2 x 10  Eccentric calf raise and DL calf raises attempted d/c due to pain DL  calf raise on leg press @ 20 lbs 2 x 10  Manual Therapy: Lt ankle inversion MWM x 10  Lt Ankle mobilizations to improve DF/PF   PATIENT EDUCATION:  Education details: HEP update Person educated: Patient Education method: Explanation, Demonstration, Tactile cues, Verbal cues, and Handouts Education comprehension: verbalized understanding, returned demonstration, verbal cues required, tactile cues required, and needs further education   HOME EXERCISE PROGRAM: Access Code: [email or web address]     ASSESSMENT: CLINICAL IMPRESSION: Patient tolerated therapy well with no adverse effects. Session somewhat limited as patient was late for scheduled visit. Continued with CKC strengthening, balance, and plyometrics without onset of pain. He demonstrates normalized and pain-free Lt ankle eversion and inversion AROM. He is able to complete SL calf raise without pain, but has difficulty controlling for inversion when completing. He has more difficulty maintaining stability with frontal plane movement as opposed to sagittal plane. With squat jumps he lands softly with equal weight-bearing, but does exhibit excessive pronation (Lt>Rt) when he lands. No reports of ankle pain throughout session.     OBJECTIVE IMPAIRMENTS: Abnormal gait, decreased activity tolerance, decreased balance, decreased endurance, decreased knowledge of condition, decreased  mobility, difficulty walking, decreased ROM, decreased strength, hypomobility, impaired flexibility, improper body mechanics, and pain.    ACTIVITY LIMITATIONS: carrying, lifting, bending, standing, squatting, and locomotion level   PARTICIPATION LIMITATIONS: meal prep, cleaning, laundry, shopping, community activity, and sport activity/recreation   PERSONAL FACTORS: Profession and Time since onset of injury/illness/exacerbation are also affecting patient's functional outcome.      GOALS: Goals reviewed with patient? Yes   SHORT TERM GOALS: Target date: 12/10/2022    Patient will be independent and compliant with initial HEP.    Baseline: issued at eval  Goal status: met   2.  Patient will demonstrate at least 13 degrees of Lt ankle eversion AROM to improve gait mechanics.  Baseline: see above  Goal status: met   3.  Patient will demonstrate at least 10 degrees of Lt ankle DF AROM to reduce stress on the ankle and knee with squatting and jumping activity.  Baseline: see above Goal status: met   4.  Patient will be able to complete full range SL calf raise on the LLE without pain to improve his ability to complete SL hopping/jumping.  Baseline: see above  Goal status: met     LONG TERM GOALS: Target date: 01/08/2023   Patient will demonstrate pain free 5/5 Lt ankle strength to improve stability with running activity.  Baseline: see above  Goal status: INITIAL   2.  Patient will score at least 73/80 on the LEFS to signify clinically meaningful improvement in functional abilities.  Baseline: see above  Goal status: INITIAL   3.  Patient will report pain as worst rated as 4/10 to reduce his current functional limitations.  Baseline: 9 Goal status: INITIAL   4.  Patient will be able to jump and hop with no signs of instability without pain in order to return to sport.  Baseline: unable  Goal status: INITIAL   5.  Patient will be independent with advanced home program to  progress/maintain current level of function.  Baseline: initial HEP issued  Goal status: INITIAL       PLAN: PT FREQUENCY: 2x/week   PT DURATION: 8 weeks   PLANNED INTERVENTIONS: Therapeutic exercises, Therapeutic activity, Neuromuscular re-education, Balance training, Gait training, Patient/Family education, Self Care, Joint mobilization, Aquatic Therapy, Dry Needling, Electrical stimulation, Cryotherapy, Vasopneumatic device, Ionotophoresis 4mg /ml Dexamethasone, Manual therapy, and  Re-evaluation   PLAN FOR NEXT SESSION: review and update HEP prn;  ankle strengthening and stretching. Balance    Gwendolyn Grant, PT, DPT, ATC 12/05/22 10:16 AM

## 2022-12-09 NOTE — Therapy (Addendum)
OUTPATIENT PHYSICAL THERAPY TREATMENT NOTE  DISCHARGE   Patient Name: Tony Gray MRN: [id number] DOB:[birth year], 22 y.o., male Today's Date: 12/10/2022   PCP: [facility]  REFERRING PROVIDER: Margart Sickles, PA-C    END OF SESSION:   PT End of Session - 12/10/22 0938     Visit Number 5    Number of Visits 17    Date for PT Re-Evaluation 01/11/23    Authorization Type [employer]    Authorization Time Period 3/14-5/12/24    Authorization - Visit Number 4    Authorization - Number of Visits 5    PT Start Time 0934    PT Stop Time 1015    PT Time Calculation (min) 41 min    Activity Tolerance Patient tolerated treatment well    Behavior During Therapy [facility] for tasks assessed/performed                Past Medical History:  Diagnosis Date   Acid reflux    Asthma    History reviewed. No pertinent surgical history. There are no problems to display for this patient.   REFERRING DIAG:  S80.02XA (ICD-10-CM) - Contusion of left knee  S93.402A (ICD-10-CM) - Left ankle sprain    THERAPY DIAG:  Pain in left ankle and joints of left foot  Acute pain of left knee  Muscle weakness (generalized)  Other abnormalities of gait and mobility  Localized edema  Rationale for Evaluation and Treatment Rehabilitation  PERTINENT HISTORY: None   PRECAUTIONS: None   SUBJECTIVE:                                                                                                                                                                                     SUBJECTIVE STATEMENT:  Patient reports he is feeling pretty good, hasn't been in pain for a while. He hasn't been running recently.  PAIN:  Are you having pain? No   OBJECTIVE: (objective measures completed at initial evaluation unless otherwise dated) PATIENT SURVEYS:  LEFS 63/80   EDEMA:  Mild swelling about Lt lateral ankle   PALPATION: TTP ATFL and CFL    LOWER  EXTREMITY ROM:   Active ROM Right eval Left eval 11/26/22 Left  12/05/22 Left   Hip flexion        Hip extension        Hip abduction        Hip adduction        Hip internal rotation        Hip external rotation        Knee flexion   WNL    Knee extension  WNL    Ankle dorsiflexion 12 2* 11   Ankle plantarflexion   WNL    Ankle inversion 20 20*  21  Ankle eversion 15 8*   18   (Blank rows = not tested)   LOWER EXTREMITY MMT:   MMT Right eval Left eval  Hip flexion      Hip extension      Hip abduction      Hip adduction      Hip internal rotation      Hip external rotation      Knee flexion   5  Knee extension   5  Ankle dorsiflexion   4+ pn  Ankle plantarflexion Full SL calf raise Partial SL calf raise  Ankle inversion   4 pn  Ankle eversion   5   (Blank rows = not tested)   LOWER EXTREMITY SPECIAL TESTS:  (+ pain) Anterior Drawer (+ pain) talar tilt (-) Squeeze    FUNCTIONAL TESTS:  Squat: WNL SLS: 30 seconds bilateral  SLS on airex: 30 seconds bilateral, moderate sway LLE    GAIT: Distance walked: 15 ft  Assistive device utilized: None Level of assistance: Complete Independence Comments: antalgic Lt     TODAY'S TREATMENT [facility] Adult PT Treatment:                                                DATE: 12/10/22 Therapeutic Exercise: Elliptical L5 R1 x 5 min while taking subjective  DL POGO hop stationary 2 x 30, fwd/bwd 2 x 30, lateral 2 x 30 SL POGO hop stationary 2 x 20, fwd/bwd 2 x 20, lateral 2 x 20 SL alternating forward hop 2 x 10 SL forward hop x 10 each SL forward hop for speed x 2 lengths each Wall switches x 10, double witches x 10 each SL heel raise x 20, edge of step x 20 each Lateral band walk with black at knees 2 x 20 down/back Wall sit heel raises 2 x 20 SLS on Airex with pallof press yellow 2 x 10 each   [facility] Adult PT Treatment:                                                DATE: 12/03/22 Therapeutic Exercise: Elliptical level 4  ramp 6 x 5 minutes  Walking lunge 4 x 15 ft  SL leg press 2 x 10 @ 80 lbs, 100 lbs SL calf raise 2 x 10   Squat jumps 3 x 5 Heel/toe walks 2 x 15 ft  Updated HEP  Neuromuscular re-ed: Step up knee driver on bosu 2 x 10 forward and lateral  SL marble pickup with shin trace x 10 each   [facility] Adult PT Treatment:                                                DATE: 11/28/22 Therapeutic Exercise: Recumbent bike L3 x 5 minutes  Slant board calf stretch 3 x 30 sec Heel raises x 10, with ball squeeze between heels 2 x 10 SLS x 30 sec, on Airex  2 x 30 sec Forward heel tap 4" x 10, 6" x 10 Triple extension at wall 2 x 5 each SL squat on BOSU 2 x 5 each - fingertip assist for balance POGO hop stationary 2 x 20, fwd/bwd 2 x 20, lateral 2 x 20   PATIENT EDUCATION:  Education details: HEP Person educated: Patient Education method: Explanation, Demonstration, Tactile cues, Verbal cues Education comprehension: verbalized understanding, returned demonstration, verbal cues required, tactile cues required, and needs further education   HOME EXERCISE PROGRAM: Access Code: [email or web address]     ASSESSMENT: CLINICAL IMPRESSION: Patient tolerated therapy well with no adverse effects. Therapy focused on progressing plyometrics and LE and ankle strengthening and control. He was able to progress to performing SL hopping and strengthening without report of pain. He does note mild discomfort with harder heel strikes. No changes made to his HEP this visit. Patient would benefit from continued skilled PT to progress his mobility and strength in order to reduce pain and maximize functional ability.    OBJECTIVE IMPAIRMENTS: Abnormal gait, decreased activity tolerance, decreased balance, decreased endurance, decreased knowledge of condition, decreased mobility, difficulty walking, decreased ROM, decreased strength, hypomobility, impaired flexibility, improper body mechanics, and pain.    ACTIVITY LIMITATIONS: carrying,  lifting, bending, standing, squatting, and locomotion level   PARTICIPATION LIMITATIONS: meal prep, cleaning, laundry, shopping, community activity, and sport activity/recreation   PERSONAL FACTORS: Profession and Time since onset of injury/illness/exacerbation are also affecting patient's functional outcome.      GOALS: Goals reviewed with patient? Yes   SHORT TERM GOALS: Target date: 12/10/2022    Patient will be independent and compliant with initial HEP.    Baseline: issued at eval  Goal status: met   2.  Patient will demonstrate at least 13 degrees of Lt ankle eversion AROM to improve gait mechanics.  Baseline: see above  Goal status: met   3.  Patient will demonstrate at least 10 degrees of Lt ankle DF AROM to reduce stress on the ankle and knee with squatting and jumping activity.  Baseline: see above Goal status: met   4.  Patient will be able to complete full range SL calf raise on the LLE without pain to improve his ability to complete SL hopping/jumping.  Baseline: see above  Goal status: met     LONG TERM GOALS: Target date: 01/08/2023   Patient will demonstrate pain free 5/5 Lt ankle strength to improve stability with running activity.  Baseline: see above  Goal status: INITIAL   2.  Patient will score at least 73/80 on the LEFS to signify clinically meaningful improvement in functional abilities.  Baseline: see above  Goal status: INITIAL   3.  Patient will report pain as worst rated as 4/10 to reduce his current functional limitations.  Baseline: 9 Goal status: INITIAL   4.  Patient will be able to jump and hop with no signs of instability without pain in order to return to sport.  Baseline: unable  Goal status: INITIAL   5.  Patient will be independent with advanced home program to progress/maintain current level of function.  Baseline: initial HEP issued  Goal status: INITIAL     PLAN: PT FREQUENCY: 2x/week   PT DURATION: 8 weeks   PLANNED  INTERVENTIONS: Therapeutic exercises, Therapeutic activity, Neuromuscular re-education, Balance training, Gait training, Patient/Family education, Self Care, Joint mobilization, Aquatic Therapy, Dry Needling, Electrical stimulation, Cryotherapy, Vasopneumatic device, Ionotophoresis 4mg /ml Dexamethasone, Manual therapy, and Re-evaluation   PLAN FOR NEXT SESSION: review and  update HEP prn;  ankle strengthening and stretching. Balance     Rosana Hoes, PT, DPT, LAT, ATC 12/10/22  10:18 AM Phone: [phone] Fax: [phone]    PHYSICAL THERAPY DISCHARGE SUMMARY  Visits from Start of Care: 5  Current functional level related to goals / functional outcomes: See above   Remaining deficits: See above   Education / Equipment: HEP   Patient agrees to discharge. Patient goals were partially met. Patient is being discharged due to not returning since the last visit.  Rosana Hoes, PT, DPT, LAT, ATC 01/09/23  11:13 AM Phone: [phone] Fax: [phone]

## 2022-12-10 ENCOUNTER — Ambulatory Visit: Payer: Medicaid Other | Attending: Physician Assistant | Admitting: Physical Therapy

## 2022-12-10 ENCOUNTER — Encounter: Payer: Self-pay | Admitting: Physical Therapy

## 2022-12-10 ENCOUNTER — Other Ambulatory Visit: Payer: Self-pay

## 2022-12-10 DIAGNOSIS — M25572 Pain in left ankle and joints of left foot: Secondary | ICD-10-CM | POA: Insufficient documentation

## 2022-12-10 DIAGNOSIS — M6281 Muscle weakness (generalized): Secondary | ICD-10-CM | POA: Insufficient documentation

## 2022-12-10 DIAGNOSIS — M25562 Pain in left knee: Secondary | ICD-10-CM | POA: Diagnosis present

## 2022-12-10 DIAGNOSIS — R600 Localized edema: Secondary | ICD-10-CM

## 2022-12-10 DIAGNOSIS — R2689 Other abnormalities of gait and mobility: Secondary | ICD-10-CM | POA: Insufficient documentation

## 2022-12-12 ENCOUNTER — Ambulatory Visit: Payer: Medicaid Other | Admitting: Physical Therapy

## 2022-12-12 ENCOUNTER — Telehealth: Payer: Self-pay | Admitting: Physical Therapy

## 2022-12-12 NOTE — Telephone Encounter (Signed)
Attempted to contact patient due to no show. Left VM informing patient of missed PT appointment and since this is his 2nd no show the the remaining appointments would be canceled other than the next scheduled, and he would be able to schedule 1 visit at a time. Any further no shows would result in discharge from therapy. Patient reminded of date/time of next schedule appointment.  Hilda Blades, PT, DPT, LAT, ATC 12/12/22  12:25 PM Phone: [phone] Fax: [phone]

## 2022-12-17 ENCOUNTER — Ambulatory Visit: Payer: Medicaid Other

## 2022-12-19 ENCOUNTER — Ambulatory Visit: Payer: Medicaid Other

## 2023-05-11 ENCOUNTER — Emergency Department (HOSPITAL_COMMUNITY)
Admission: EM | Admit: 2023-05-11 | Discharge: 2023-05-12 | Disposition: A | Discharge status: Home / Self Care | Payer: Medicaid Other | Attending: Emergency Medicine | Admitting: Emergency Medicine

## 2023-05-11 ENCOUNTER — Encounter (HOSPITAL_COMMUNITY): Payer: Self-pay

## 2023-05-11 DIAGNOSIS — R1084 Generalized abdominal pain: Secondary | ICD-10-CM | POA: Diagnosis not present

## 2023-05-11 DIAGNOSIS — R110 Nausea: Secondary | ICD-10-CM | POA: Insufficient documentation

## 2023-05-11 DIAGNOSIS — R109 Unspecified abdominal pain: Secondary | ICD-10-CM | POA: Diagnosis present

## 2023-05-11 LAB — COMPREHENSIVE METABOLIC PANEL
ALT: 34 U/L (ref 0–44)
AST: 36 U/L (ref 15–41)
Albumin: 4.7 g/dL (ref 3.5–5.0)
Alkaline Phosphatase: 78 U/L (ref 38–126)
Anion gap: 9 (ref 5–15)
BUN: 11 mg/dL (ref 6–20)
CO2: 27 mmol/L (ref 22–32)
Calcium: 9.8 mg/dL (ref 8.9–10.3)
Chloride: 101 mmol/L (ref 98–111)
Creatinine, Ser: 1.36 mg/dL — ABNORMAL HIGH (ref 0.61–1.24)
GFR, Estimated: >60 mL/min (ref >60)
Glucose, Bld: 96 mg/dL (ref 70–99)
Potassium: 3.9 mmol/L (ref 3.5–5.1)
Sodium: 137 mmol/L (ref 135–145)
Total Bilirubin: 1.4 mg/dL — ABNORMAL HIGH (ref 0.3–1.2)
Total Protein: 7.9 g/dL (ref 6.5–8.1)

## 2023-05-11 LAB — URINALYSIS, ROUTINE W REFLEX MICROSCOPIC
Bilirubin Urine: NEGATIVE
Glucose, UA: NEGATIVE mg/dL
Hgb urine dipstick: NEGATIVE
Ketones, ur: NEGATIVE mg/dL
Leukocytes,Ua: NEGATIVE
Nitrite: NEGATIVE
Protein, ur: NEGATIVE mg/dL
Specific Gravity, Urine: 1.012 (ref 1.005–1.030)
pH: 9 — ABNORMAL HIGH (ref 5.0–8.0)

## 2023-05-11 LAB — CBC
HCT: 43.4 % (ref 39.0–52.0)
Hemoglobin: 14.2 g/dL (ref 13.0–17.0)
MCH: 28.3 pg (ref 26.0–34.0)
MCHC: 32.7 g/dL (ref 30.0–36.0)
MCV: 86.6 fL (ref 80.0–100.0)
Platelets: 248 10*3/uL (ref 150–400)
RBC: 5.01 MIL/uL (ref 4.22–5.81)
RDW: 12.2 % (ref 11.5–15.5)
WBC: 9.8 10*3/uL (ref 4.0–10.5)
nRBC: 0 % (ref 0.0–0.2)

## 2023-05-11 LAB — LIPASE, BLOOD: Lipase: 23 U/L (ref 11–51)

## 2023-05-11 MED ORDER — LACTATED RINGERS IV BOLUS
1000.0000 mL | Freq: Once | INTRAVENOUS | Status: AC
  Administered 2023-05-11: 1000 mL via INTRAVENOUS

## 2023-05-11 MED ORDER — ONDANSETRON HCL 4 MG/2ML IJ SOLN
4.0000 mg | Freq: Once | INTRAMUSCULAR | Status: AC
  Administered 2023-05-11: 4 mg via INTRAVENOUS
  Filled 2023-05-11: qty 2

## 2023-05-11 MED ORDER — MORPHINE SULFATE (PF) 4 MG/ML IV SOLN
4.0000 mg | Freq: Once | INTRAVENOUS | Status: AC
  Administered 2023-05-11: 4 mg via INTRAVENOUS
  Filled 2023-05-11: qty 1

## 2023-05-11 NOTE — ED Triage Notes (Signed)
Pt states that he has been having lower abd pain since yesterday, with nausea, no diarrhea or fevers. Denies urinary symptoms

## 2023-05-11 NOTE — ED Provider Notes (Signed)
[facility]PT [facility] Emergency Department Provider Note MRN:  [id number]  Arrival date & time: 05/12/23     Chief Complaint   Abdominal Pain   History of Present Illness   Tony Gray is a 22 y.o. year-old male presents to the ED with chief complaint of abdominal pain with nausea.  States that he has been having worsening symptoms over the past day or 2.  He states that he has been having frequent bowel movements, but denies diarrhea.  He denies having had vomiting.  Denies fevers or chills.  States the pain is in his upper abdomen and radiates around both sides, but also complains of pain in the right lower quadrant.  Denies any treatments PTA.  History provided by patient.   Review of Systems  Pertinent positive and negative review of systems noted in HPI.    Physical Exam   Vitals:   05/12/23 0145 05/12/23 0200  BP: (!) 115/55 126/60  Pulse: 71 66  Resp: 18 18  Temp:    SpO2: 98% 97%    CONSTITUTIONAL:  well-appearing, NAD NEURO:  Alert and oriented x 3, CN 3-12 grossly intact EYES:  eyes equal and reactive ENT/NECK:  Supple, no stridor  CARDIO:  normal rate, regular rhythm, appears well-perfused  PULM:  No respiratory distress, CTAB GI/GU:  non-distended, moderate RLQ tenderness, mild epigastric tendernes MSK/SPINE:  No gross deformities, no edema, moves all extremities  SKIN:  no rash, atraumatic   *Additional and/or pertinent findings included in MDM below  Diagnostic and Interventional Summary    EKG Interpretation Date/Time:    Ventricular Rate:    PR Interval:    QRS Duration:    QT Interval:    QTC Calculation:   R Axis:      Text Interpretation:         Labs Reviewed  COMPREHENSIVE METABOLIC PANEL - Abnormal; Notable for the following components:      Result Value   Creatinine, Ser 1.36 (*)    Total Bilirubin 1.4 (*)    All other components within normal limits  URINALYSIS, ROUTINE W REFLEX MICROSCOPIC - Abnormal;  Notable for the following components:   Color, Urine STRAW (*)    pH 9.0 (*)    All other components within normal limits  LIPASE, BLOOD  CBC    CT ABDOMEN PELVIS W CONTRAST  Final Result      Medications  lactated ringers bolus 1,000 mL (0 mLs Intravenous Stopped 05/12/23 0137)  morphine (PF) 4 MG/ML injection 4 mg (4 mg Intravenous Given 05/11/23 2358)  ondansetron (ZOFRAN) injection 4 mg (4 mg Intravenous Given 05/11/23 2359)  iohexol (OMNIPAQUE) 350 MG/ML injection 75 mL (75 mLs Intravenous Contrast Given 05/12/23 0048)     Procedures  /  Critical Care Procedures  ED Course and Medical Decision Making  I have reviewed the triage vital signs, the nursing notes, and pertinent available records from the EMR.  Social Determinants Affecting Complexity of Care: Patient has no clinically significant social determinants affecting this chief complaint..   ED Course:    Medical Decision Making Patient here with generalized abdominal pain, he does have some right lower quadrant tenderness.  He is also had some associated nausea.  Will check imaging.  Labs are fairly reassuring.  Patient feeling improved after treatment in the ED.  Labs and imaging are reassuring.  Recommend PCP follow-up.  Return precautions discussed.  Amount and/or Complexity of Data Reviewed Labs: ordered.    Details: Normal lipase,  doubt pancreatitis, mildly elevated creatinine, could be mild dehydration, no significant electrolyte derangement, no significant leukocytosis or anemia Radiology: ordered and independent interpretation performed.    Details: No obvious large volume free fluid or air, no kidney stone seen  Risk Prescription drug management.       Ddx includes, but not limited to: appy, cholecystitis, pancreatitis, KS, UTI/pyelo,  Doubt appy, no focal RLQ tenderness at McBurney's point. Normal appendix seen on CT. Normal lipase, doubt pancreatitis. LFTs reassuring, no focal RUQ tenderness, doubt  cholecystitis. No stone seen on imaging, doubt kidney stone.  Consultants: No consultations were needed in caring for this patient.   Treatment and Plan: I considered admission due to patient's initial presentation, but after considering the examination and diagnostic results, patient will not require admission and can be discharged with outpatient follow-up.    Final Clinical Impressions(s) / ED Diagnoses     ICD-10-CM   1. Generalized abdominal pain  R10.84     2. Nausea  R11.0       ED Discharge Orders          Ordered    ondansetron (ZOFRAN-ODT) 4 MG disintegrating tablet  Every 8 hours PRN        05/12/23 0225              Discharge Instructions Discussed with and Provided to Patient:   Discharge Instructions   None      Roxy Horseman, PA-C 05/12/23 0227    Gilda Crease, MD 05/14/23 [phone]

## 2023-05-12 ENCOUNTER — Emergency Department (HOSPITAL_COMMUNITY): Payer: Medicaid Other

## 2023-05-12 ENCOUNTER — Other Ambulatory Visit: Payer: Self-pay

## 2023-05-12 MED ORDER — ONDANSETRON 4 MG PO TBDP: 4.0000 mg | ORAL_TABLET | Freq: Three times a day (TID) | ORAL | 0 refills | Status: AC | PRN

## 2023-05-12 MED ORDER — IOHEXOL 350 MG/ML SOLN
75.0000 mL | Freq: Once | INTRAVENOUS | Status: AC | PRN
  Administered 2023-05-12: 75 mL via INTRAVENOUS

## 2023-07-01 ENCOUNTER — Ambulatory Visit (INDEPENDENT_AMBULATORY_CARE_PROVIDER_SITE_OTHER): Payer: Medicaid Other | Admitting: Neurology

## 2023-07-01 ENCOUNTER — Encounter (INDEPENDENT_AMBULATORY_CARE_PROVIDER_SITE_OTHER): Payer: Self-pay | Admitting: Neurology

## 2023-07-01 VITALS — BP 132/72 | HR 66

## 2023-07-01 DIAGNOSIS — G44209 Tension-type headache, unspecified, not intractable: Secondary | ICD-10-CM | POA: Diagnosis not present

## 2023-07-01 DIAGNOSIS — G43009 Migraine without aura, not intractable, without status migrainosus: Secondary | ICD-10-CM | POA: Diagnosis not present

## 2023-07-01 MED ORDER — AMITRIPTYLINE HCL 25 MG PO TABS: ORAL_TABLET | ORAL | 4 refills | Status: AC

## 2023-07-01 NOTE — Patient Instructions (Signed)
Continue with more hydration, adequate sleep and limited screen time Start taking Migrelief daily Make a diary of the headache Start taking amitriptyline at 1.5 tablet every night May take 600 mg of ibuprofen for moderate to severe headache Return in 3 months for follow-up visit

## 2023-07-01 NOTE — Progress Notes (Signed)
Patient: Tony Gray MRN: [id number] Sex: male DOB: 03-24-2001  Provider: Keturah Shavers, MD Location of Care: [facility] Child Neurology  Note type: New patient  Referral Source: PCP History from: patient, [facility] chart, Chief Complaint: Tension headache   History of Present Illness: Tony Gray is a 22 y.o. male is here for follow-up management of headache with recent exacerbation. He was last seen in April 2022.  He has history of migraine and tension type headaches for which he was on amitriptyline and the dose of medication decreased since he was doing better with less frequent headaches and he was recommended to follow-up in a few months. He has not had any follow-up visit and he was taking the same low-dose amitriptyline for a while with just occasional headaches but over the past month he has been having more episodes of headache particularly last week when he had a severe migraine type headache with significant dizziness and nausea for 2 or 3 days without any relief with OTC medications. Although patient mentioned that he has not been taking amitriptyline regularly and it was just as needed when he was having bad headaches but over the past couple of weeks he started taking the medication regularly and he is taking 1 tablet every night. He usually sleeps well without any difficulty and with no awakening headaches.  He has no visual changes.  He has no double vision.  He has not had any frequent vomiting except for 1 episode with his major headache last week.  Overall he is doing fairly well without having any other medical issues and has not been on any other medications except for allergy medications.  Review of Systems: Review of system as per HPI, otherwise negative.  Past Medical History:  Diagnosis Date   Acid reflux    Asthma    Hospitalizations: No., Head Injury: No., Nervous System Infections: No., Immunizations up to date: Yes.     Surgical History History  reviewed. No pertinent surgical history.  Family History family history includes Migraines in his brother, brother, and brother; Seizures in his brother.   Social History Social History   Socioeconomic History   Marital status: Single    Spouse name: Not on file   Number of children: Not on file   Years of education: Not on file   Highest education level: Not on file  Occupational History   Not on file  Tobacco Use   Smoking status: Never   Smokeless tobacco: Never  Substance and Sexual Activity   Alcohol use: Not on file   Drug use: Not on file   Sexual activity: Not on file  Other Topics Concern   Not on file  Social History Narrative   Lives with mom and siblings.    GRADUATED from [employer]   Social Determinants of Health   Financial Resource Strain: Not on File (12/27/2021)   Received from [employer]    Financial Resource Strain: 0  Food Insecurity: Not on File (06/05/2023)   Received from [facility] Insecurity    Food: 0  Transportation Needs: Not on File (12/27/2021)   Received from [employer] Needs    Transportation: 0  Physical Activity: Not on File (12/27/2021)   Received from [facility]   Physical Activity    Physical Activity: 0  Stress: Not on File (12/27/2021)   Received from [facility]   Stress    Stress: 0  Social Connections: Not on File (  05/24/2023)   Received from [facility]   Social Connections    Connectedness: 0     No Known Allergies  Physical Exam BP 132/72   Pulse 66  Gen: Awake, alert, not in distress Skin: No rash, No neurocutaneous stigmata. HEENT: Normocephalic, no dysmorphic features, no conjunctival injection, nares patent, mucous membranes moist, oropharynx clear. Neck: Supple, no meningismus. No focal tenderness. Resp: Clear to auscultation bilaterally CV: Regular rate, normal S1/S2, no murmurs, no rubs Abd: BS present, abdomen soft, non-tender, non-distended. No hepatosplenomegaly or  mass Ext: Warm and well-perfused. No deformities, no muscle wasting, ROM full.  Neurological Examination: MS: Awake, alert, interactive. Normal eye contact, answered the questions appropriately, speech was fluent,  Normal comprehension.  Attention and concentration were normal. Cranial Nerves: Pupils were equal and reactive to light ( 5-54mm);  normal fundoscopic exam with sharp discs, visual field full with confrontation test; EOM normal, no nystagmus; no ptsosis, no double vision, intact facial sensation, face symmetric with full strength of facial muscles, hearing intact to finger rub bilaterally, palate elevation is symmetric, tongue protrusion is symmetric with full movement to both sides.  Sternocleidomastoid and trapezius are with normal strength. Tone-Normal Strength-Normal strength in all muscle groups DTRs-  Biceps Triceps Brachioradialis Patellar Ankle  R 2+ 2+ 2+ 2+ 2+  L 2+ 2+ 2+ 2+ 2+   Plantar responses flexor bilaterally, no clonus noted Sensation: Intact to light touch, temperature, vibration, Romberg negative. Coordination: No dysmetria on FTN test. No difficulty with balance. Gait: Normal walk and run. Tandem gait was normal. Was able to perform toe walking and heel walking without difficulty.   Assessment and Plan 1. Migraine without aura and without status migrainosus, not intractable   2. Tension headache     This is a 22 year old male with history of migraine and tension type headaches for a few years but he had exacerbation of his symptoms over the past week with an episode of severe headache with dizziness and nausea.  He has no focal findings on his neurological examination with no evidence of intracranial pathology on exam at this time. Recommend to start taking amitriptyline regularly at the dose appropriate for his weight which would be 37.5 mg every night and see how he does.   He needs to have Hydration with adequate sleep and limited screen time. He may take  occasional Tylenol or ibuprofen with appropriate dose such as 600 mg of ibuprofen for moderate to severe headache. He may benefit from taking dietary supplements such as Migrelief He will make a headache diary and bring it on his next visit. I would like to see him in 3 months for follow-up visit and based on his headache diary may adjust the dose of medication. If he needs to continue medication for longer time then we may refer him to adult neurology for continuing care.  He understood and agreed with the plan.  Meds ordered this encounter  Medications   amitriptyline (ELAVIL) 25 MG tablet    Sig: Take 1.5 tablet every night    Dispense:  45 tablet    Refill:  4   No orders of the defined types were placed in this encounter.

## 2023-10-01 ENCOUNTER — Encounter (INDEPENDENT_AMBULATORY_CARE_PROVIDER_SITE_OTHER): Payer: Self-pay | Admitting: Neurology

## 2023-10-01 ENCOUNTER — Ambulatory Visit (INDEPENDENT_AMBULATORY_CARE_PROVIDER_SITE_OTHER): Payer: Medicaid Other | Admitting: Neurology

## 2023-10-01 VITALS — BP 122/70 | HR 62 | Ht 70.87 in | Wt 147.7 lb

## 2023-10-01 DIAGNOSIS — G44209 Tension-type headache, unspecified, not intractable: Secondary | ICD-10-CM | POA: Diagnosis not present

## 2023-10-01 DIAGNOSIS — G43009 Migraine without aura, not intractable, without status migrainosus: Secondary | ICD-10-CM

## 2023-10-01 NOTE — Progress Notes (Signed)
Patient: Tony Gray MRN: [id number] Sex: male DOB: Oct 30, 2000  Provider: Keturah Shavers, MD Location of Care: [facility] Child Neurology  Note type: Routine return visit  Referral Source: Wilmot, Max, PA History from: patient, [facility] chart,  Chief Complaint: Migraine  History of Present Illness: Tony Gray is a 23 y.o. male is here for follow-up management of headaches. He has been having chronic migraine and tension type headaches for which he was on preventive medication for a while and then it was discontinued and then when he was seen on his last visit in October since he was having more frequent headaches, he was started on amitriptyline again and recommended to follow-up in a few months to see how he does. Since his last visit he has been doing very well without having any frequent headaches and did not need to take OTC medications and as per patient he has been taking amitriptyline off-and-on but not regularly every night and he is not taking the appropriate dose that was recommended and just take 1 tablet every night and usually 3 or 4 nights a week or so. He has no other complaints or concerns at this time and usually sleeps well without any difficulty and has no stress or anxiety or mood issues.  Currently he is in Dietitian and otherwise doing well.  Review of Systems: Review of system as per HPI, otherwise negative.  Past Medical History:  Diagnosis Date   Acid reflux    Asthma    Hospitalizations: No., Head Injury: No., Nervous System Infections: No., Immunizations up to date: Yes.      Surgical History History reviewed. No pertinent surgical history.  Family History family history includes Migraines in his brother, brother, and brother; Seizures in his brother.   Social History Social History   Socioeconomic History   Marital status: Single    Spouse name: Not on file   Number of children: Not on file   Years of education: Not on file   Highest  education level: Not on file  Occupational History   Not on file  Tobacco Use   Smoking status: Never   Smokeless tobacco: Never  Substance and Sexual Activity   Alcohol use: Not on file   Drug use: Not on file   Sexual activity: Not on file  Other Topics Concern   Not on file  Social History Narrative   Lives with mom and siblings.    GRADUATED from [employer]   Social Drivers of Health   Financial Resource Strain: Not at Risk (07/29/2023)   Received from [employer]    Financial Resource Strain: 1  Food Insecurity: Not at Risk (07/29/2023)   Received from [facility] Insecurity    Food: 1  Transportation Needs: Not at Risk (07/29/2023)   Received from [employer] Needs    Transportation: 1  Physical Activity: Not on File (12/27/2021)   Received from [facility]   Physical Activity    Physical Activity: 0  Stress: Not on File (12/27/2021)   Received from [facility]   Stress    Stress: 0  Social Connections: Not on File (05/24/2023)   Received from [facility]   Social Connections    Connectedness: 0     No Known Allergies  Physical Exam BP 122/70   Pulse 62   Ht 5' 10.87" (1.8 m)   Wt 147 lb 11.3 oz (67 kg)   BMI 20.68 kg/m  Gen: Awake,  alert, not in distress Skin: No rash, No neurocutaneous stigmata. HEENT: Normocephalic, no dysmorphic features, no conjunctival injection, nares patent, mucous membranes moist, oropharynx clear. Neck: Supple, no meningismus. No focal tenderness. Resp: Clear to auscultation bilaterally CV: Regular rate, normal S1/S2, no murmurs, no rubs Abd: BS present, abdomen soft, non-tender, non-distended. No hepatosplenomegaly or mass Ext: Warm and well-perfused. No deformities, no muscle wasting, ROM full.  Neurological Examination: MS: Awake, alert, interactive. Normal eye contact, answered the questions appropriately, speech was fluent,  Normal comprehension.  Attention and concentration were normal. Cranial  Nerves: Pupils were equal and reactive to light ( 5-78mm);  normal fundoscopic exam with sharp discs, visual field full with confrontation test; EOM normal, no nystagmus; no ptsosis, no double vision, intact facial sensation, face symmetric with full strength of facial muscles, hearing intact to finger rub bilaterally, palate elevation is symmetric, tongue protrusion is symmetric with full movement to both sides.  Sternocleidomastoid and trapezius are with normal strength. Tone-Normal Strength-Normal strength in all muscle groups DTRs-  Biceps Triceps Brachioradialis Patellar Ankle  R 2+ 2+ 2+ 2+ 2+  L 2+ 2+ 2+ 2+ 2+   Plantar responses flexor bilaterally, no clonus noted Sensation: Intact to light touch, temperature, vibration, Romberg negative. Coordination: No dysmetria on FTN test. No difficulty with balance. Gait: Normal walk and run. Tandem gait was normal. Was able to perform toe walking and heel walking without difficulty.   Assessment and Plan 1. Migraine without aura and without status migrainosus, not intractable   2. Tension headache    This is a 23 year old male with history of migraine and tension type headaches for which he was on amitriptyline for a while and it was restarted on his last visit in October but he has been doing well without having any frequent headaches over the past couple of months and he is not taking the medication regularly.  He has no focal findings on his neurological examination. Since he is not having any headaches and not taking the medication regularly, I do not think he needs to continue medication. He needs to continue with adequate sleep and limited screen time and more hydration He may take occasional Tylenol or ibuprofen for moderate to severe headache If he develops more frequent headaches then he needs to get a referral to see adult neurology for further assessment and management of the headaches otherwise he will continue follow-up with his  primary care physician.  He understood and agreed with the plan.  No orders of the defined types were placed in this encounter.  No orders of the defined types were placed in this encounter.

## 2024-01-16 ENCOUNTER — Ambulatory Visit
Admission: EM | Admit: 2024-01-16 | Discharge: 2024-01-16 | Disposition: A | Discharge status: Home / Self Care | Attending: Family Medicine | Admitting: Family Medicine

## 2024-01-16 ENCOUNTER — Encounter: Payer: Self-pay | Admitting: Emergency Medicine

## 2024-01-16 ENCOUNTER — Ambulatory Visit (INDEPENDENT_AMBULATORY_CARE_PROVIDER_SITE_OTHER): Admitting: Radiology

## 2024-01-16 DIAGNOSIS — S9782XA Crushing injury of left foot, initial encounter: Secondary | ICD-10-CM

## 2024-01-16 MED ORDER — IBUPROFEN 800 MG PO TABS: 800.0000 mg | ORAL_TABLET | Freq: Three times a day (TID) | ORAL | 0 refills | Status: AC

## 2024-01-16 NOTE — Discharge Instructions (Addendum)
 I do not see any obvious fractures or deformities on your imaging, official radiology overread will be back within the next few hours and you will be contacted if we need to change or update your treatment plan.  You most likely have deep bruising.  Rest, ice and elevate your foot.  800 milligrams of ibuprofen  3 times daily can help with pain and swelling.  You can use the postop shoe to help provide protection and support to your foot.  If your pain does not improve over the next week or so, follow-up with the orthopedic or foot specialist for further evaluation.  Return to clinic for new or urgent symptoms.

## 2024-01-16 NOTE — ED Triage Notes (Addendum)
 Pt presents after he dropped a railroad log on top of left foot yesterday.

## 2024-01-16 NOTE — ED Provider Notes (Signed)
 Tony Gray    CSN: [id number] Arrival date & time: 01/16/24  9562      History   Chief Complaint Chief Complaint  Patient presents with   Foot Injury    HPI Tony Gray is a 23 y.o. male.   Patient presents to clinic over concern of left foot pain. He is in [employer] for [employer] and yesterday he dropped a railroad log onto his foot. Immediately after he had patient, reports running away and screaming. Got through out the pain yesterday, decided to come into clinic today due to continued same levels of pain. Has had 600 mg of IBU PTA. Ambulatory.   The history is provided by the patient and medical records.  Foot Injury   Past Medical History:  Diagnosis Date   Acid reflux    Asthma     There are no active problems to display for this patient.   History reviewed. No pertinent surgical history.     Home Medications    Prior to Admission medications   Medication Sig Start Date End Date Taking? Authorizing Provider  ibuprofen  (ADVIL ) 800 MG tablet Take 1 tablet (800 mg total) by mouth 3 (three) times daily. 01/16/24  Yes Moneisha Vosler  N, FNP  albuterol  (PROVENTIL ) (2.5 MG/3ML) 0.083% nebulizer solution Take 3 mLs (2.5 mg total) by nebulization every 4 (four) hours as needed for wheezing or shortness of breath. 08/11/17   Laura Polio, MD  amitriptyline  (ELAVIL ) 25 MG tablet Take 1.5 tablet every night 07/01/23   Ventura Gins, MD  B-Complex TABS Once daily Patient not taking: Reported on 10/01/2023 06/09/20   Ventura Gins, MD  cetirizine (ZYRTEC) 10 MG tablet Take by mouth. 06/27/23   [provider]  fluticasone (FLONASE) 50 MCG/ACT nasal spray Place into the nose. 06/27/23   [provider]  HYDROcodone-acetaminophen (NORCO/VICODIN) 5-325 MG tablet Take 1 tablet by mouth every 4 (four) hours as needed. Patient not taking: Reported on 10/01/2023 01/29/23   [provider]  Magnesium  Oxide 500 MG TABS Take 1 tablet (500 mg  total) by mouth daily. Patient not taking: Reported on 10/01/2023 06/09/20   Ventura Gins, MD  ondansetron  (ZOFRAN -ODT) 4 MG disintegrating tablet Take 1 tablet (4 mg total) by mouth every 8 (eight) hours as needed for nausea or vomiting. 05/12/23   Sherel Dikes, PA-C  predniSONE  (DELTASONE ) 20 MG tablet Take 3 tablets (60 mg total) by mouth daily. Patient not taking: Reported on 10/01/2023 08/11/17   Laura Polio, MD    Family History Family History  Problem Relation Age of Onset   Migraines Brother    Seizures Brother    Migraines Brother    Migraines Brother    Autism Neg Hx    ADD / ADHD Neg Hx    Anxiety disorder Neg Hx    Depression Neg Hx    Bipolar disorder Neg Hx    Schizophrenia Neg Hx     Social History Social History   Tobacco Use   Smoking status: Never   Smokeless tobacco: Never     Allergies   Patient has no known allergies.   Review of Systems Review of Systems  Per HPI  Physical Exam Triage Vital Signs ED Triage Vitals  Encounter Vitals Group     BP 01/16/24 0837 (!) 122/54     Systolic BP Percentile --      Diastolic BP Percentile --      Pulse Rate 01/16/24 0837 62     Resp 01/16/24  0837 16     Temp 01/16/24 0837 98.2 F (36.8 C)     Temp Source 01/16/24 0837 Oral     SpO2 01/16/24 0837 97 %     Weight --      Height --      Head Circumference --      Peak Flow --      Pain Score 01/16/24 0836 7     Pain Loc --      Pain Education --      Exclude from Growth Chart --    No data found.  Updated Vital Signs BP (!) 122/54 (BP Location: Left Arm)   Pulse 62   Temp 98.2 F (36.8 C) (Oral)   Resp 16   SpO2 97%   Visual Acuity Right Eye Distance:   Left Eye Distance:   Bilateral Distance:    Right Eye Near:   Left Eye Near:    Bilateral Near:     Physical Exam Vitals and nursing note reviewed.  Constitutional:      Appearance: Normal appearance.  HENT:     Head: Normocephalic and atraumatic.     Right Ear: External  ear normal.     Left Ear: External ear normal.     Nose: Nose normal.     Mouth/Throat:     Mouth: Mucous membranes are moist.  Eyes:     Conjunctiva/sclera: Conjunctivae normal.  Cardiovascular:     Rate and Rhythm: Normal rate.     Pulses: Normal pulses.  Pulmonary:     Effort: Pulmonary effort is normal. No respiratory distress.  Musculoskeletal:        General: Tenderness and signs of injury present. No swelling or deformity. Normal range of motion.       Feet:  Skin:    General: Skin is warm and dry.     Capillary Refill: Capillary refill takes less than 2 seconds.  Neurological:     General: No focal deficit present.     Mental Status: He is alert.  Psychiatric:        Mood and Affect: Mood normal.        Behavior: Behavior is cooperative.      Gray Treatments / Results  Labs (all labs ordered are listed, but only abnormal results are displayed) Labs Reviewed - No data to display  EKG   Radiology DG Foot Complete Left Result Date: 01/16/2024 CLINICAL DATA:  Injury. EXAM: LEFT FOOT - COMPLETE 3+ VIEW COMPARISON:  None Available. FINDINGS: There is no evidence of fracture or dislocation. There is no evidence of arthropathy or other focal bone abnormality. Mild soft tissue swelling of the dorsal foot. No radiopaque foreign body. IMPRESSION: No acute osseous abnormality. Electronically Signed   By: Mannie Seek M.D.   On: 01/16/2024 10:03    Procedures Procedures (including critical care time)  Medications Ordered in Gray Medications - No data to display  Initial Impression / Assessment and Plan / Gray Course  I have reviewed the triage vital signs and the nursing notes.  Pertinent labs & imaging results that were available during my care of the patient were reviewed by me and considered in my medical decision making (see chart for details).  Vitals in triage reviewed, patient is hemodynamically stable.  Tenderness to palpation over first MTP joint of left foot,  where railroad log dropped on his foot.  Brisk capillary refill distally to injury, no obvious deformity.  Sensation intact.  Imaging by my  interpretation does not show any acute fractures or bony abnormalities.  Official radiology interpretation shows no acute bony abnormalities.  Provided with postop shoe.  Pain management discussed.  Orthopedic follow-up encouraged if pain persist.  Plan of care, follow-up care return precautions given, no questions at this time.     Final Clinical Impressions(s) / Gray Diagnoses   Final diagnoses:  Crushing injury of left foot, initial encounter     Discharge Instructions      I do not see any obvious fractures or deformities on your imaging, official radiology overread will be back within the next few hours and you will be contacted if we need to change or update your treatment plan.  You most likely have deep bruising.  Rest, ice and elevate your foot.  800 milligrams of ibuprofen  3 times daily can help with pain and swelling.  You can use the postop shoe to help provide protection and support to your foot.  If your pain does not improve over the next week or so, follow-up with the orthopedic or foot specialist for further evaluation.  Return to clinic for new or urgent symptoms.   ED Prescriptions     Medication Sig Dispense Auth. Provider   ibuprofen  (ADVIL ) 800 MG tablet Take 1 tablet (800 mg total) by mouth 3 (three) times daily. 21 tablet Harlow Lighter, Jaymi Tinner  N, FNP      PDMP not reviewed this encounter.   Harlow Lighter, Yekaterina Escutia  N, FNP 01/16/24 1043
